# Patient Record
Sex: Female | Born: 1975 | Race: Black or African American | Hispanic: No | Marital: Single | Smoking: Never smoker
Health system: Southern US, Community
[De-identification: ages and names within clinical notes are randomized; demographics above are authoritative.]

---

## 2003-09-29 ENCOUNTER — Encounter: Payer: Self-pay | Admitting: Internal Medicine

## 2003-09-29 ENCOUNTER — Ambulatory Visit (HOSPITAL_COMMUNITY): Admission: RE | Admit: 2003-09-29 | Discharge: 2003-09-29 | Payer: Self-pay | Admitting: Internal Medicine

## 2007-07-22 ENCOUNTER — Ambulatory Visit: Payer: Self-pay | Admitting: Internal Medicine

## 2008-03-08 ENCOUNTER — Ambulatory Visit: Payer: Self-pay | Admitting: Internal Medicine

## 2014-09-07 ENCOUNTER — Other Ambulatory Visit (HOSPITAL_COMMUNITY): Payer: Self-pay | Admitting: Obstetrics & Gynecology

## 2014-09-07 DIAGNOSIS — N979 Female infertility, unspecified: Secondary | ICD-10-CM

## 2014-09-14 ENCOUNTER — Ambulatory Visit (HOSPITAL_COMMUNITY)
Admission: RE | Admit: 2014-09-14 | Discharge: 2014-09-14 | Disposition: A | Discharge status: Home / Self Care | Payer: Managed Care, Other (non HMO) | Source: Ambulatory Visit | Attending: Obstetrics & Gynecology | Admitting: Obstetrics & Gynecology

## 2014-09-14 DIAGNOSIS — N979 Female infertility, unspecified: Secondary | ICD-10-CM | POA: Insufficient documentation

## 2014-09-14 MED ORDER — IOHEXOL 300 MG/ML  SOLN
20.0000 mL | Freq: Once | INTRAMUSCULAR | Status: AC | PRN
  Administered 2014-09-14: 20 mL via INTRAVENOUS

## 2015-11-02 IMAGING — RF DG HYSTEROGRAM
4 series · 4 of 4 positions shown · IV contrast (omnipaque)
Comparison: None.

FLUOROSCOPY TIME:  18 seconds

CLINICAL DATA: Infertility

EXAM:
HYSTEROSALPINGOGRAM
TECHNIQUE: Following cleansing of the cervix and vagina with Betadine solution,
a hysterosalpingogram was performed using a 5-French
hysterosalpingogram catheter and Omnipaque 300 contrast. The patient
tolerated the examination without difficulty.

[Series 1: run · 1 of 1 slices shown (1 of 4)]
[im 1/1]
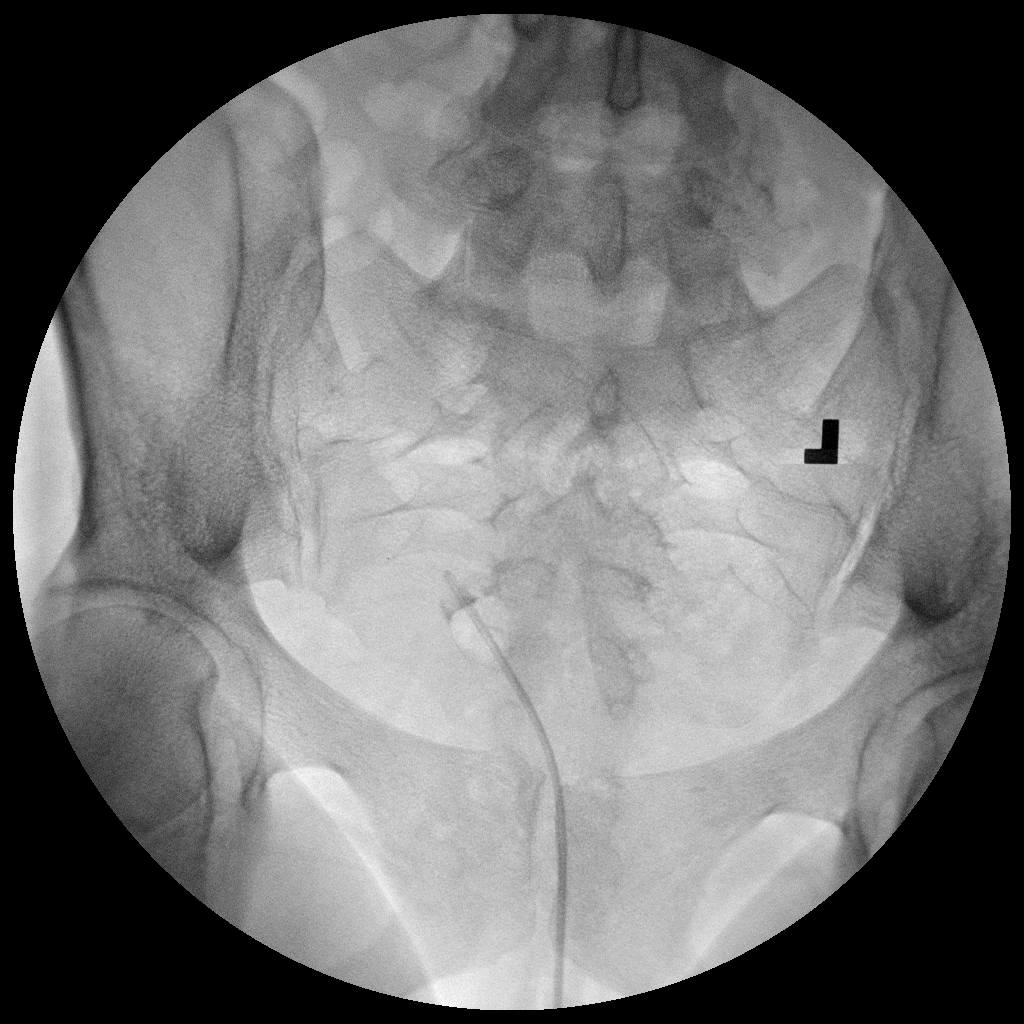

[Series 2: run · 1 of 1 slices shown (2 of 4)]
[im 1/1]
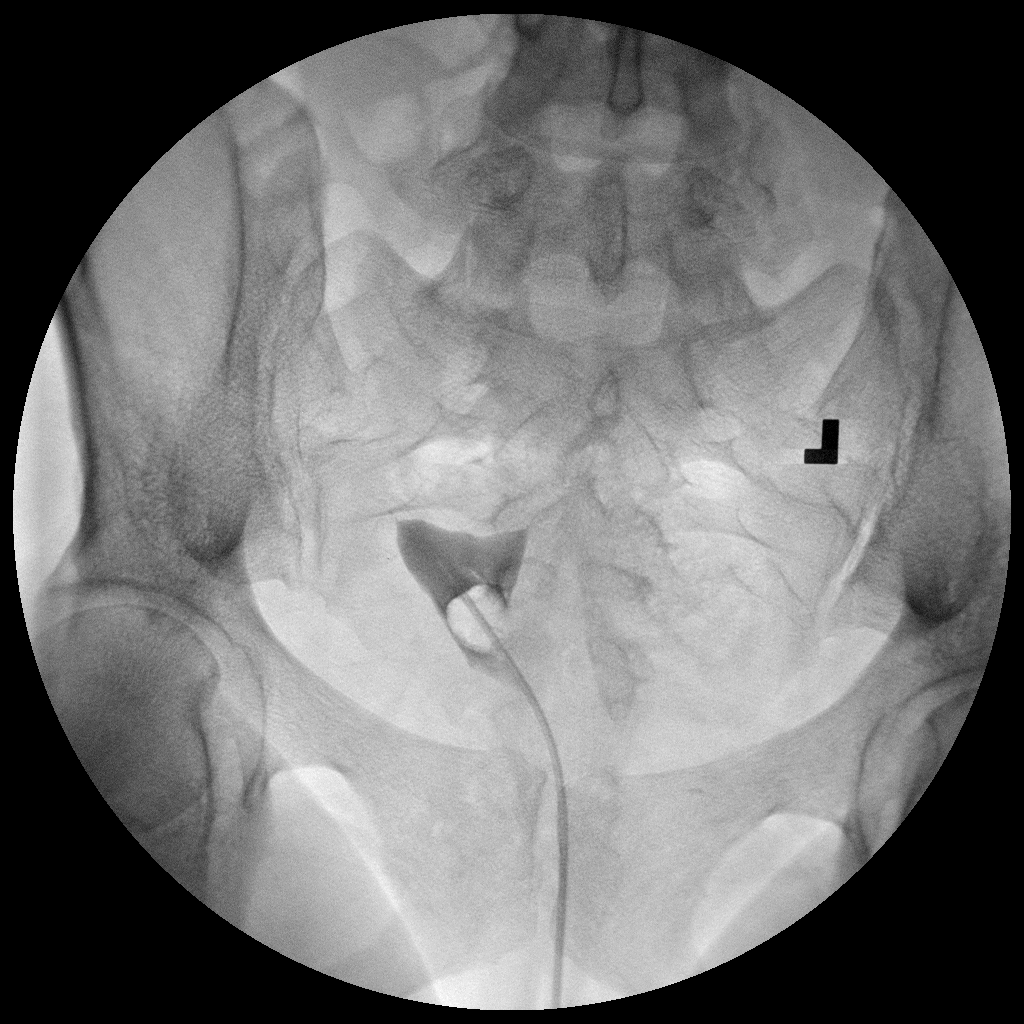

[Series 3: run · 1 of 1 slices shown (3 of 4)]
[im 1/1]
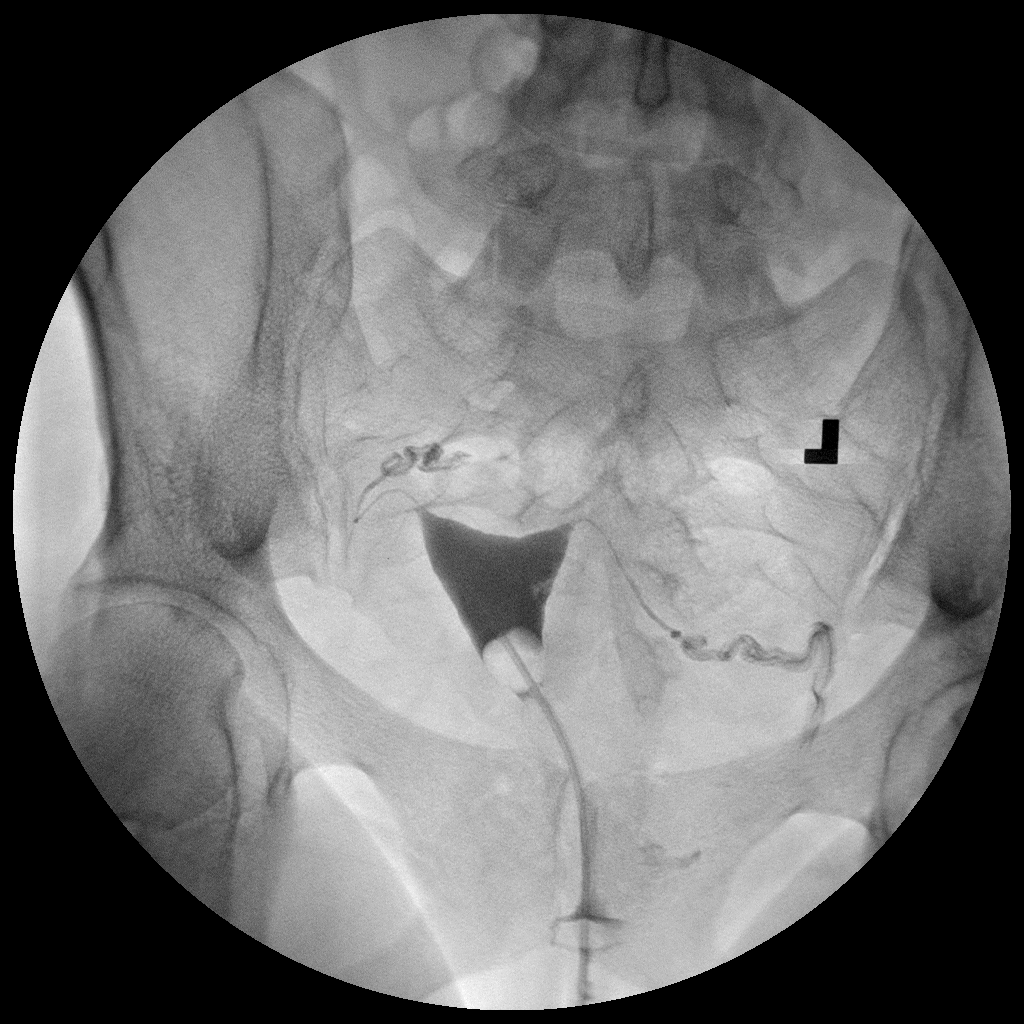

[Series 4: run · 1 of 1 slices shown (4 of 4)]
[im 1/1]
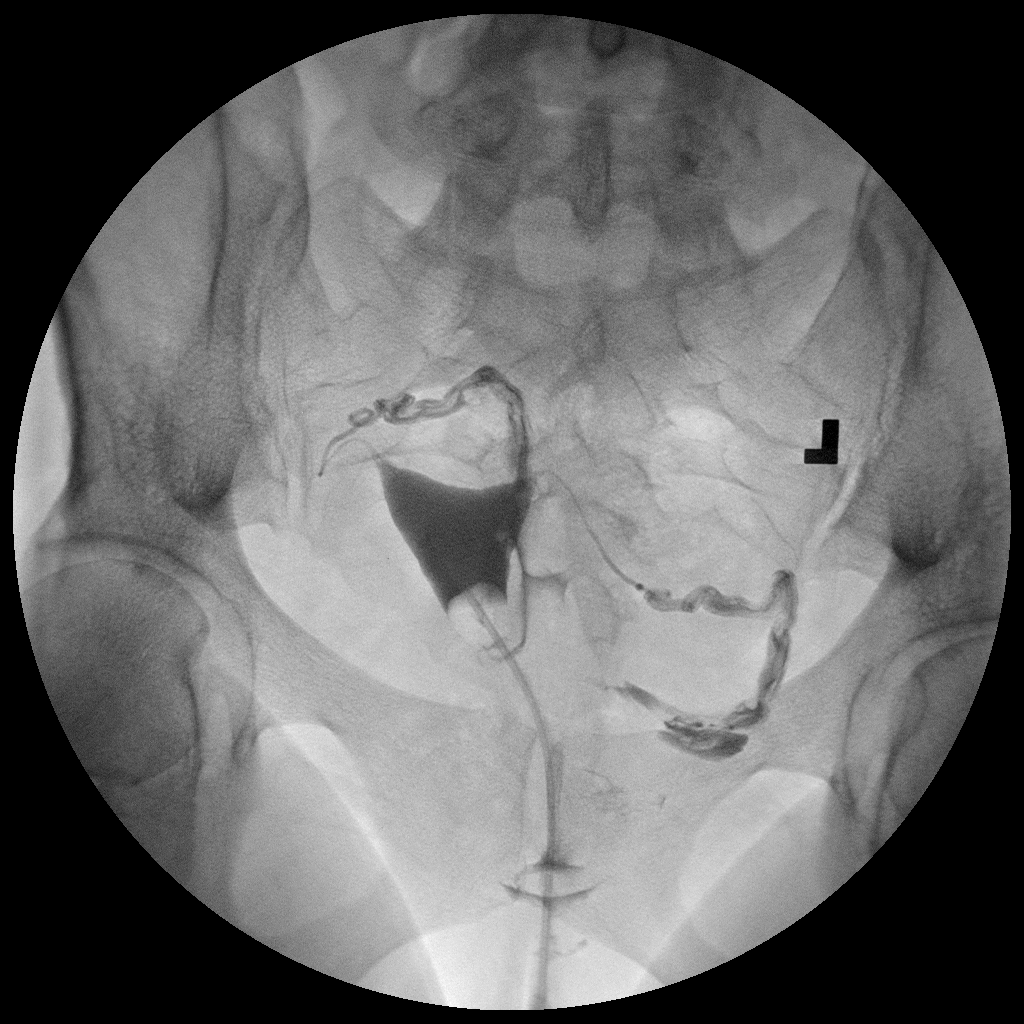

[4 of 4 positions shown; findings below may reference images not displayed]

FINDINGS: The endometrial cavity is normal in appearance and contour. No signs
of mullerian duct anomaly.

Opacification of both fallopian tubes is seen. Both tubes appear
normal. Intraperitoneal spill of contrast from both fallopian tubes
is demonstrated.
IMPRESSION: Normal study. Both fallopian tubes are patent.

## 2016-08-24 DIAGNOSIS — Z319 Encounter for procreative management, unspecified: Secondary | ICD-10-CM | POA: Insufficient documentation

## 2017-03-12 DIAGNOSIS — O09529 Supervision of elderly multigravida, unspecified trimester: Secondary | ICD-10-CM | POA: Insufficient documentation

## 2017-03-12 DIAGNOSIS — D259 Leiomyoma of uterus, unspecified: Secondary | ICD-10-CM | POA: Insufficient documentation

## 2017-03-12 DIAGNOSIS — Z3490 Encounter for supervision of normal pregnancy, unspecified, unspecified trimester: Secondary | ICD-10-CM | POA: Insufficient documentation

## 2017-03-19 DIAGNOSIS — Z3490 Encounter for supervision of normal pregnancy, unspecified, unspecified trimester: Secondary | ICD-10-CM | POA: Insufficient documentation

## 2017-10-01 DIAGNOSIS — N979 Female infertility, unspecified: Secondary | ICD-10-CM | POA: Insufficient documentation

## 2017-10-01 DIAGNOSIS — Z8759 Personal history of other complications of pregnancy, childbirth and the puerperium: Secondary | ICD-10-CM | POA: Insufficient documentation

## 2017-10-01 DIAGNOSIS — N926 Irregular menstruation, unspecified: Secondary | ICD-10-CM | POA: Insufficient documentation

## 2017-10-02 ENCOUNTER — Ambulatory Visit: Payer: Managed Care, Other (non HMO)

## 2017-10-02 ENCOUNTER — Ambulatory Visit (INDEPENDENT_AMBULATORY_CARE_PROVIDER_SITE_OTHER): Payer: 59 | Admitting: Podiatry

## 2017-10-02 ENCOUNTER — Encounter: Payer: Self-pay | Admitting: Podiatry

## 2017-10-02 VITALS — BP 123/86 | HR 85 | Resp 16

## 2017-10-02 DIAGNOSIS — M778 Other enthesopathies, not elsewhere classified: Secondary | ICD-10-CM

## 2017-10-02 DIAGNOSIS — M779 Enthesopathy, unspecified: Principal | ICD-10-CM

## 2017-10-02 DIAGNOSIS — M7752 Other enthesopathy of left foot: Secondary | ICD-10-CM | POA: Diagnosis not present

## 2017-10-02 NOTE — Progress Notes (Signed)
  Subjective:  Patient ID: Christy Brooks, female    DOB: 17-Jul-1976,  MRN: [id number] HPI Chief Complaint  Patient presents with  . Foot Pain    Plantar forefoot left - tenderness x 1.5 weeks, swollen, painful to walk, 3rd toe has pain  . NOTE    Patient is [redacted] weeks pregnant    41 y.o. female presents with the above complaint.     No past medical history on file. No past surgical history on file.  Current Outpatient Prescriptions:  .  Calcium Carbonate Antacid (ANTACID E-X PO), Take by mouth., Disp: , Rfl:  .  prenatal vitamin w/FE, FA (PRENATAL 1 + 1) 27-1 MG TABS tablet, Prenatal, Disp: , Rfl:   Allergies  Allergen Reactions  . Shellfish Allergy     Other reaction(s): Other (See Comments) Please confirm reaction with pt   Review of Systems  All other systems reviewed and are negative.  Objective:   Vitals:   10/02/17 0809  BP: 123/86  Pulse: 85  Resp: 16    General: Well developed, nourished, in no acute distress, alert and oriented x3   Dermatological: Skin is warm, dry and supple bilateral. Nails x 10 are well maintained; remaining integument appears unremarkable at this time. There are no open sores, no preulcerative lesions, no rash or signs of infection present.  Vascular: Dorsalis Pedis artery and Posterior Tibial artery pedal pulses are 2/4 bilateral with immedate capillary fill time. Pedal hair growth present. No varicosities and no lower extremity edema present bilateral.   Neruologic: Grossly intact via light touch bilateral. Vibratory intact via tuning fork bilateral. Protective threshold with Semmes Wienstein monofilament intact to all pedal sites bilateral. Patellar and Achilles deep tendon reflexes 2+ bilateral. No Babinski or clonus noted bilateral.   Musculoskeletal: No gross boney pedal deformities bilateral. No pain, crepitus, or limitation noted with foot and ankle range of motion bilateral. Muscular strength 5/5 in all groups tested bilateral.She  has pain on end range of motion of the second and third metatarsophalangeal joints. No hammertoe deformities are noted. Mild tenderness on palpation plantar plate. No deviation of the toe with flexion.  Gait: Unassisted, Nonantalgic.    Radiographs:  None taken due to pregnancy  Assessment & Plan:   Assessment: Capsulitis second metatarsophalangeal joint third metatarsophalangeal joint left foot.  Plan: Discussed proper shoe gearshifting exercises ice therapy. Follow up with me after pregnancy.     Christy Brooks T. [place], [place]

## 2017-10-07 ENCOUNTER — Ambulatory Visit: Payer: Self-pay | Admitting: Podiatry

## 2017-10-17 ENCOUNTER — Telehealth: Payer: Self-pay | Admitting: Podiatry

## 2017-10-17 NOTE — Telephone Encounter (Signed)
I saw Dr. Milinda Pointer on 11 October for cyst's and swelling. He told me since I was pregnant at the time he would not be able to do anything to help me. Well, I delivered my baby about a week and a half ago now, so I was wanting to know if he would be able to treat me now. He said something about giving me a cortisone injection to help with the swelling. I am currently breast feeding so my main question is would he be able to do any kind of treatment while I'm breast feeding. If you could please give me a call back at [phone]. I look forward to speaking with you.

## 2017-10-17 NOTE — Telephone Encounter (Signed)
I informed pt that she would need medical clearance from either her obstetrician or baby's pediatrician to receive the cortisone injection. Pt asked if she would need an x-ray and if she could have the medical clearance faxed to our office. I told her if she had not been x-rayed that may be performed at the visit and she could fax to [phone].

## 2017-11-04 ENCOUNTER — Ambulatory Visit: Payer: 59 | Admitting: Podiatry

## 2017-11-05 ENCOUNTER — Ambulatory Visit (INDEPENDENT_AMBULATORY_CARE_PROVIDER_SITE_OTHER): Payer: 59 | Admitting: Podiatry

## 2017-11-05 ENCOUNTER — Ambulatory Visit: Payer: Self-pay

## 2017-11-05 DIAGNOSIS — M7751 Other enthesopathy of right foot: Secondary | ICD-10-CM | POA: Diagnosis not present

## 2017-11-05 DIAGNOSIS — M7752 Other enthesopathy of left foot: Secondary | ICD-10-CM

## 2017-11-05 DIAGNOSIS — M659 Unspecified synovitis and tenosynovitis, unspecified site: Secondary | ICD-10-CM

## 2017-11-06 ENCOUNTER — Telehealth: Payer: Self-pay | Admitting: Podiatry

## 2017-11-06 NOTE — Telephone Encounter (Signed)
Left message for pt to call to discuss orthotic coverage. 

## 2017-11-09 NOTE — Progress Notes (Signed)
   HPI: 41 year old female presenting today with a complaint of plantar forefoot pain bilaterally that began several weeks ago. She reports associated swelling. She states it feels as if she has cysts on the area. She has not done anything to treat the symptoms. She is here for further evaluation and treatment.   No past medical history on file.   Physical Exam: General: The patient is alert and oriented x3 in no acute distress.  Dermatology: Skin is warm, dry and supple bilateral lower extremities. Negative for open lesions or macerations.  Vascular: Palpable pedal pulses bilaterally. No edema or erythema noted. Capillary refill within normal limits.  Neurological: Epicritic and protective threshold grossly intact bilaterally.   Musculoskeletal Exam: Pain with palpation to the 2nd MPJ of the right foot and the 3rd MPJ of the left foot. Range of motion within normal limits to all pedal and ankle joints bilateral. Muscle strength 5/5 in all groups bilateral.   Radiographic Exam:  Normal osseous mineralization. Joint spaces preserved. No fracture/dislocation/boney destruction.    Assessment: - 2nd MPJ capsulitis right - 3rd MPJ capsulitis left   Plan of Care:  - Patient evaluated. X-Rays reviews.  - Injection of 0.5 mLs Celestone Soluspan injected into the 2nd MPJ of the right foot. - Injection of 0.5 mLs Celestone Soluspan injected into the 3rd MPJ of the left foot. - Pre-authorization request sent to [facility] for custom molded orthotics.  - Return to clinic when necessary.  Gave birth to a 7 pound 10 ounce baby boy on 10/06/17 via c-section.   Edrick Kins, DPM [facility]  Dr. Edrick Kins, DPM    [address], [place] [address]                Office [phone]  Fax [phone]

## 2017-11-19 ENCOUNTER — Telehealth: Payer: Self-pay | Admitting: Podiatry

## 2017-11-19 NOTE — Telephone Encounter (Signed)
I was calling to see if I can get a copy of my x-rays burned to a CD to see a specialists. I will also sign a release form. I will swing by today to see if I can pick those up. If you have an opportunity can you please get those ready. You can reach me at [phone] and I shall see you soon. Thanks so much.

## 2017-11-19 NOTE — Telephone Encounter (Signed)
Pt called back in regards to message she left about requesting her CD of x-rays. I told her I would not be able to have those available for her today that I needed to push the images over to her chart from the server. I told her I would have them ready for her and have them up front by 4:00 pm tomorrow. Pt asked if she could fill out the medical records release form when she came in to which I replied that is fine. She stated thank you and she would be in tomorrow.

## 2017-11-19 NOTE — Telephone Encounter (Signed)
I attempted to return pt's phone call. Left voicemail letting pt know I would not be able to have a CD of her x-rays ready for her today. I told her I would have to push them over from the server. I let her know I can work on it tomorrow and have them ready for her by 4:00 pm at the front desk for her to pick up. I explained that she would need to sign a medical records release form and that she would also need to pay a $5.00 for the CD of x-rays. I told her if she had any other questions in regards to this to call me back at  [phone].

## 2017-11-20 DIAGNOSIS — M79676 Pain in unspecified toe(s): Secondary | ICD-10-CM

## 2017-11-21 ENCOUNTER — Telehealth: Payer: Self-pay | Admitting: Podiatry

## 2017-11-21 NOTE — Telephone Encounter (Addendum)
I came in yesterday and picked up my CD of x-rays. I've been referred to an orthopaedic doctor and I need my notes faxed to Dr. Rip Harbour at [facility] at [phone]. I just need the last notes faxed. Please call me and let me know when you have completed this at [phone]. Thank you for your time. Have a good day.

## 2017-11-21 NOTE — Telephone Encounter (Signed)
Called pt back in regards to her request for her notes to be faxed. I asked her if she could type it in an e-mail and e-mail to me so I can have it properly documented when I go to enter the medical records request. Pt asked me to hold on so she could get something to write down my e-mail. While holding our phone call got disconnected. I called the pt back at 10:32 am after being on the phone and checking messages and she took my work e-mail. She said she would get it emailed to me in about an hour and I told her once I had that e-mail I would get her records faxed for her.

## 2017-11-24 ENCOUNTER — Telehealth: Payer: Self-pay | Admitting: Podiatry

## 2017-11-24 NOTE — Telephone Encounter (Signed)
[place] back and spoke with Webb Silversmith. Told her I faxed those records this morning. She placed me on hold to speak with Ena Dawley to see if she had received them yet. I spoke with Ena Dawley and at first she did not see the fax I sent but as we spoke she saw it. She thanked me for sending that over.

## 2017-11-24 NOTE — Telephone Encounter (Signed)
Was calling to check on the status of the medical records request on this pt. She has an appointment with one of our doctors today at 2:30 pm. Please call us back at [phone]. Thank you.

## 2019-05-24 DIAGNOSIS — O2412 Pre-existing diabetes mellitus, type 2, in childbirth: Secondary | ICD-10-CM | POA: Diagnosis not present

## 2019-05-24 DIAGNOSIS — Z79899 Other long term (current) drug therapy: Secondary | ICD-10-CM | POA: Diagnosis not present

## 2019-05-24 DIAGNOSIS — Z91013 Allergy to seafood: Secondary | ICD-10-CM | POA: Diagnosis not present

## 2019-05-24 DIAGNOSIS — L910 Hypertrophic scar: Secondary | ICD-10-CM | POA: Diagnosis not present

## 2019-05-24 DIAGNOSIS — E119 Type 2 diabetes mellitus without complications: Secondary | ICD-10-CM | POA: Diagnosis not present

## 2019-05-24 DIAGNOSIS — Z91018 Allergy to other foods: Secondary | ICD-10-CM | POA: Diagnosis not present

## 2019-05-24 DIAGNOSIS — O3413 Maternal care for benign tumor of corpus uteri, third trimester: Secondary | ICD-10-CM | POA: Diagnosis not present

## 2019-05-24 DIAGNOSIS — Z3A39 39 weeks gestation of pregnancy: Secondary | ICD-10-CM | POA: Diagnosis not present

## 2019-05-24 DIAGNOSIS — O34211 Maternal care for low transverse scar from previous cesarean delivery: Secondary | ICD-10-CM | POA: Diagnosis not present

## 2019-05-24 DIAGNOSIS — R1905 Periumbilic swelling, mass or lump: Secondary | ICD-10-CM | POA: Diagnosis not present

## 2019-05-24 DIAGNOSIS — Z794 Long term (current) use of insulin: Secondary | ICD-10-CM | POA: Diagnosis not present

## 2019-11-15 DIAGNOSIS — E1165 Type 2 diabetes mellitus with hyperglycemia: Secondary | ICD-10-CM | POA: Diagnosis not present

## 2019-11-15 DIAGNOSIS — Z1389 Encounter for screening for other disorder: Secondary | ICD-10-CM | POA: Diagnosis not present

## 2019-11-15 DIAGNOSIS — Z6830 Body mass index (BMI) 30.0-30.9, adult: Secondary | ICD-10-CM | POA: Diagnosis not present

## 2019-11-15 DIAGNOSIS — I10 Essential (primary) hypertension: Secondary | ICD-10-CM | POA: Diagnosis not present

## 2020-07-20 DIAGNOSIS — H5213 Myopia, bilateral: Secondary | ICD-10-CM | POA: Diagnosis not present

## 2020-07-25 DIAGNOSIS — Z1231 Encounter for screening mammogram for malignant neoplasm of breast: Secondary | ICD-10-CM | POA: Diagnosis not present

## 2020-12-20 DIAGNOSIS — L918 Other hypertrophic disorders of the skin: Secondary | ICD-10-CM | POA: Diagnosis not present

## 2020-12-20 DIAGNOSIS — R69 Illness, unspecified: Secondary | ICD-10-CM | POA: Diagnosis not present

## 2020-12-20 DIAGNOSIS — Z01411 Encounter for gynecological examination (general) (routine) with abnormal findings: Secondary | ICD-10-CM | POA: Diagnosis not present

## 2020-12-20 DIAGNOSIS — Z124 Encounter for screening for malignant neoplasm of cervix: Secondary | ICD-10-CM | POA: Diagnosis not present

## 2021-06-21 ENCOUNTER — Ambulatory Visit
Admission: EM | Admit: 2021-06-21 | Discharge: 2021-06-21 | Disposition: A | Discharge status: Home / Self Care | Payer: No Typology Code available for payment source

## 2021-06-21 ENCOUNTER — Encounter: Payer: Self-pay | Admitting: *Deleted

## 2021-06-21 DIAGNOSIS — S161XXA Strain of muscle, fascia and tendon at neck level, initial encounter: Secondary | ICD-10-CM

## 2021-06-21 MED ORDER — TIZANIDINE HCL 2 MG PO TABS: 2.0000 mg | ORAL_TABLET | Freq: Four times a day (QID) | ORAL | 0 refills | Status: AC | PRN

## 2021-06-21 MED ORDER — NAPROXEN 500 MG PO TABS: 500.0000 mg | ORAL_TABLET | Freq: Two times a day (BID) | ORAL | 0 refills | Status: AC

## 2021-06-21 NOTE — Discharge Instructions (Addendum)
Naprosyn twice daily with food over the next 7 to 10 days Support tizanidine at home/bedtime-this is a muscle relaxer, may cause drowsiness, do not drive or work after taking Alternate ice and heat Gentle stretching-see attached Follow-up if not improving or worsening

## 2021-06-21 NOTE — ED Provider Notes (Signed)
[facility]    CSN: [id number] Arrival date & time: 06/21/21  1355      History   Chief Complaint No chief complaint on file.   HPI Christy Brooks is a 45 y.o. female presenting today for evaluation of neck pain.  Reports that she was carrying a heavy 50 pound bag of sand and felt a twinge in her posterior neck.  This occurred yesterday.  Feels similar to when she has strained muscle within her neck in the past.  She denies any limitation in range of motion, mainly just pain with movement.  Denies any headaches, vision changes, dizziness or lightheadedness.  Denies any numbness or tingling into extremity.  HPI  Past Medical History:  Diagnosis Date  . Diabetes mellitus without complication [facility])     Patient Active Problem List   Diagnosis Date Noted  . Female infertility 10/01/2017  . History of miscarriage 10/01/2017  . Irregular periods 10/01/2017  . Pregnant 03/19/2017  . AMA (advanced maternal age) multigravida 35+ 03/12/2017  . Uterine leiomyoma 03/12/2017  . Pregnancy 03/12/2017  . Infertility management 08/24/2016    History reviewed. No pertinent surgical history.  OB History     Gravida  1   Para      Term      Preterm      AB      Living         SAB      IAB      Ectopic      Multiple      Live Births               Home Medications    Prior to Admission medications   Medication Sig Start Date End Date Taking? Authorizing Provider  Cetirizine HCl (ZYRTEC PO) Take by mouth.   Yes [provider]  naproxen (NAPROSYN) 500 MG tablet Take 1 tablet (500 mg total) by mouth 2 (two) times daily. 06/21/21  Yes Oluwatimilehin Balfour C, PA-C  tiZANidine (ZANAFLEX) 2 MG tablet Take 1-2 tablets (2-4 mg total) by mouth every 6 (six) hours as needed for muscle spasms. 06/21/21  Yes Ailene Royal C, PA-C  Calcium Carbonate Antacid (ANTACID E-X PO) Take by mouth.    [provider]  prenatal vitamin w/FE, FA (PRENATAL 1 + 1)  27-1 MG TABS tablet Prenatal    [provider]    Family History Family History  Problem Relation Age of Onset  . Arthritis/Rheumatoid Mother     Social History Social History   Tobacco Use  . Smoking status: Never  . Smokeless tobacco: Never  Vaping Use  . Vaping Use: Never used  Substance Use Topics  . Alcohol use: Yes    Comment: occasionally     Allergies   Shellfish allergy   Review of Systems Review of Systems  Constitutional:  Negative for fatigue and fever.  Eyes:  Negative for visual disturbance.  Respiratory:  Negative for shortness of breath.   Cardiovascular:  Negative for chest pain.  Gastrointestinal:  Negative for abdominal pain, nausea and vomiting.  Musculoskeletal:  Positive for myalgias and neck pain. Negative for arthralgias and joint swelling.  Skin:  Negative for color change, rash and wound.  Neurological:  Negative for dizziness, weakness, light-headedness and headaches.    Physical Exam Triage Vital Signs ED Triage Vitals  Enc Vitals Group     BP 06/21/21 1402 115/77     Pulse Rate 06/21/21 1402 86  Resp 06/21/21 1402 16     Temp 06/21/21 1402 98.1 F (36.7 C)     Temp Source 06/21/21 1402 Temporal     SpO2 06/21/21 1402 96 %     Weight --      Height --      Head Circumference --      Peak Flow --      Pain Score 06/21/21 1403 8     Pain Loc --      Pain Edu? --      Excl. in [place]? --    No data found.  Updated Vital Signs BP 115/77   Pulse 86   Temp 98.1 F (36.7 C) (Temporal)   Resp 16   LMP 06/20/2021 (Exact Date)   SpO2 96%   Breastfeeding No   Visual Acuity Right Eye Distance:   Left Eye Distance:   Bilateral Distance:    Right Eye Near:   Left Eye Near:    Bilateral Near:     Physical Exam Vitals and nursing note reviewed.  Constitutional:      Appearance: She is well-developed.     Comments: No acute distress  HENT:     Head: Normocephalic and atraumatic.     Nose: Nose normal.  Eyes:      Conjunctiva/sclera: Conjunctivae normal.  Cardiovascular:     Rate and Rhythm: Normal rate.  Pulmonary:     Effort: Pulmonary effort is normal. No respiratory distress.  Abdominal:     General: There is no distension.  Musculoskeletal:        General: Normal range of motion.     Cervical back: Neck supple.     Comments: Neck: Nontender to palpation of cervical spine midline, increased tenderness throughout left cervical musculature extending into superior trapezius towards left shoulder, relatively full active range of motion of neck, full active range of motion of left shoulder  Grip strength 5/5 ankle bilaterally, radial pulse 2+ bilaterally  Skin:    General: Skin is warm and dry.  Neurological:     General: No focal deficit present.     Mental Status: She is alert and oriented to person, place, and time. Mental status is at baseline.     Cranial Nerves: No cranial nerve deficit.     Motor: No weakness.     Gait: Gait normal.     UC Treatments / Results  Labs (all labs ordered are listed, but only abnormal results are displayed) Labs Reviewed - No data to display  EKG   Radiology No results found.  Procedures Procedures (including critical care time)  Medications Ordered in UC Medications - No data to display  Initial Impression / Assessment and Plan / UC Course  I have reviewed the triage vital signs and the nursing notes.  Pertinent labs & imaging results that were available during my care of the patient were reviewed by me and considered in my medical decision making (see chart for details).     Cervical/trapezius strain-1 day of symptoms, recommending NSAIDs anti-inflammatories, muscle relaxers, gentle stretching ice and heat and monitor for gradual resolution of the next 1 to 2 weeks.  Discussed strict return precautions. Patient verbalized understanding and is agreeable with plan.  Final Clinical Impressions(s) / UC Diagnoses   Final diagnoses:   Cervical strain, acute, initial encounter     Discharge Instructions      Naprosyn twice daily with food over the next 7 to 10 days Support tizanidine at home/bedtime-this is  a muscle relaxer, may cause drowsiness, do not drive or work after taking Alternate ice and heat Gentle stretching-see attached Follow-up if not improving or worsening     ED Prescriptions     Medication Sig Dispense Auth. Provider   tiZANidine (ZANAFLEX) 2 MG tablet Take 1-2 tablets (2-4 mg total) by mouth every 6 (six) hours as needed for muscle spasms. 30 tablet Holten Spano C, PA-C   naproxen (NAPROSYN) 500 MG tablet Take 1 tablet (500 mg total) by mouth 2 (two) times daily. 30 tablet Xayvion Shirah, Napoleon C, PA-C      PDMP not reviewed this encounter.   Janith Lima, [place] 06/21/21 1429

## 2021-06-21 NOTE — ED Triage Notes (Signed)
States was lifting 50lb bags sand into her car when she felt a "twinge" of posterior neck pain; after unloading the bags at home, felt a more intense posterior neck pain that has been constant.  Denies any parasthesias or weakness. Has tried Aleve, Tyl, Advil, Salon Pas, Icy Hot without relief.

## 2021-08-03 DIAGNOSIS — Z01419 Encounter for gynecological examination (general) (routine) without abnormal findings: Secondary | ICD-10-CM | POA: Diagnosis not present

## 2021-08-08 DIAGNOSIS — Z1231 Encounter for screening mammogram for malignant neoplasm of breast: Secondary | ICD-10-CM | POA: Diagnosis not present

## 2022-01-31 DIAGNOSIS — J029 Acute pharyngitis, unspecified: Secondary | ICD-10-CM | POA: Diagnosis not present

## 2022-03-08 DIAGNOSIS — I4891 Unspecified atrial fibrillation: Secondary | ICD-10-CM | POA: Diagnosis not present

## 2022-03-14 DIAGNOSIS — R0683 Snoring: Secondary | ICD-10-CM | POA: Diagnosis not present

## 2022-03-14 DIAGNOSIS — I4891 Unspecified atrial fibrillation: Secondary | ICD-10-CM | POA: Diagnosis not present

## 2022-03-18 DIAGNOSIS — R002 Palpitations: Secondary | ICD-10-CM | POA: Diagnosis not present

## 2022-05-09 DIAGNOSIS — R002 Palpitations: Secondary | ICD-10-CM | POA: Diagnosis not present

## 2022-05-16 DIAGNOSIS — M545 Low back pain, unspecified: Secondary | ICD-10-CM | POA: Diagnosis not present

## 2022-06-19 DIAGNOSIS — G4733 Obstructive sleep apnea (adult) (pediatric): Secondary | ICD-10-CM | POA: Diagnosis not present

## 2022-07-08 DIAGNOSIS — I480 Paroxysmal atrial fibrillation: Secondary | ICD-10-CM | POA: Diagnosis not present

## 2022-07-08 DIAGNOSIS — R002 Palpitations: Secondary | ICD-10-CM | POA: Diagnosis not present

## 2022-07-08 DIAGNOSIS — I471 Supraventricular tachycardia: Secondary | ICD-10-CM | POA: Diagnosis not present

## 2022-07-09 DIAGNOSIS — R002 Palpitations: Secondary | ICD-10-CM | POA: Diagnosis not present

## 2022-09-02 DIAGNOSIS — Z131 Encounter for screening for diabetes mellitus: Secondary | ICD-10-CM | POA: Diagnosis not present

## 2022-09-02 DIAGNOSIS — Z13220 Encounter for screening for lipoid disorders: Secondary | ICD-10-CM | POA: Diagnosis not present

## 2022-09-02 DIAGNOSIS — Z713 Dietary counseling and surveillance: Secondary | ICD-10-CM | POA: Diagnosis not present

## 2022-09-02 DIAGNOSIS — Z6828 Body mass index (BMI) 28.0-28.9, adult: Secondary | ICD-10-CM | POA: Diagnosis not present

## 2022-09-02 DIAGNOSIS — E1100 Type 2 diabetes mellitus with hyperosmolarity without nonketotic hyperglycemic-hyperosmolar coma (NKHHC): Secondary | ICD-10-CM | POA: Diagnosis not present

## 2022-11-05 DIAGNOSIS — Z124 Encounter for screening for malignant neoplasm of cervix: Secondary | ICD-10-CM | POA: Diagnosis not present

## 2022-11-05 DIAGNOSIS — Z1231 Encounter for screening mammogram for malignant neoplasm of breast: Secondary | ICD-10-CM | POA: Diagnosis not present

## 2022-11-05 DIAGNOSIS — Z01419 Encounter for gynecological examination (general) (routine) without abnormal findings: Secondary | ICD-10-CM | POA: Diagnosis not present

## 2023-01-05 DIAGNOSIS — S29012A Strain of muscle and tendon of back wall of thorax, initial encounter: Secondary | ICD-10-CM | POA: Diagnosis not present

## 2023-01-20 DIAGNOSIS — R002 Palpitations: Secondary | ICD-10-CM | POA: Diagnosis not present

## 2023-07-18 DIAGNOSIS — E1169 Type 2 diabetes mellitus with other specified complication: Secondary | ICD-10-CM | POA: Diagnosis not present

## 2023-07-18 DIAGNOSIS — Z789 Other specified health status: Secondary | ICD-10-CM | POA: Diagnosis not present

## 2023-07-18 DIAGNOSIS — E7800 Pure hypercholesterolemia, unspecified: Secondary | ICD-10-CM | POA: Diagnosis not present

## 2023-10-10 DIAGNOSIS — R9431 Abnormal electrocardiogram [ECG] [EKG]: Secondary | ICD-10-CM | POA: Diagnosis not present

## 2023-10-10 DIAGNOSIS — I4891 Unspecified atrial fibrillation: Secondary | ICD-10-CM | POA: Diagnosis not present

## 2023-11-29 DIAGNOSIS — R600 Localized edema: Secondary | ICD-10-CM | POA: Diagnosis not present

## 2023-11-29 DIAGNOSIS — S93402A Sprain of unspecified ligament of left ankle, initial encounter: Secondary | ICD-10-CM | POA: Diagnosis not present

## 2023-11-29 DIAGNOSIS — M25572 Pain in left ankle and joints of left foot: Secondary | ICD-10-CM | POA: Diagnosis not present
